# Patient Record
Sex: Male | Born: 1985 | Race: White | Hispanic: No | Smoking: Current every day smoker
Health system: Southern US, Community
[De-identification: ages and names within clinical notes are randomized; demographics above are authoritative.]

---

## 2020-02-27 ENCOUNTER — Emergency Department: Payer: Medicaid Other

## 2020-02-27 ENCOUNTER — Encounter: Payer: Self-pay | Admitting: Emergency Medicine

## 2020-02-27 ENCOUNTER — Other Ambulatory Visit: Payer: Self-pay

## 2020-02-27 ENCOUNTER — Emergency Department
Admission: EM | Admit: 2020-02-27 | Discharge: 2020-02-28 | Disposition: A | Discharge status: Other Acute Inpatient Hospital | Payer: Medicaid Other | Attending: Emergency Medicine | Admitting: Emergency Medicine

## 2020-02-27 DIAGNOSIS — F329 Major depressive disorder, single episode, unspecified: Secondary | ICD-10-CM | POA: Insufficient documentation

## 2020-02-27 DIAGNOSIS — R45851 Suicidal ideations: Secondary | ICD-10-CM | POA: Insufficient documentation

## 2020-02-27 DIAGNOSIS — Z20822 Contact with and (suspected) exposure to covid-19: Secondary | ICD-10-CM | POA: Insufficient documentation

## 2020-02-27 DIAGNOSIS — F10929 Alcohol use, unspecified with intoxication, unspecified: Secondary | ICD-10-CM

## 2020-02-27 DIAGNOSIS — F10920 Alcohol use, unspecified with intoxication, uncomplicated: Secondary | ICD-10-CM | POA: Insufficient documentation

## 2020-02-27 DIAGNOSIS — F332 Major depressive disorder, recurrent severe without psychotic features: Secondary | ICD-10-CM | POA: Diagnosis present

## 2020-02-27 LAB — URINE DRUG SCREEN, QUALITATIVE (ARMC ONLY)
Amphetamines, Ur Screen: NOT DETECTED
Barbiturates, Ur Screen: NOT DETECTED
Benzodiazepine, Ur Scrn: NOT DETECTED
Cannabinoid 50 Ng, Ur ~~LOC~~: POSITIVE — AB
Cocaine Metabolite,Ur ~~LOC~~: NOT DETECTED
MDMA (Ecstasy)Ur Screen: NOT DETECTED
Methadone Scn, Ur: NOT DETECTED
Opiate, Ur Screen: NOT DETECTED
Phencyclidine (PCP) Ur S: NOT DETECTED
Tricyclic, Ur Screen: NOT DETECTED

## 2020-02-27 LAB — COMPREHENSIVE METABOLIC PANEL
ALT: 31 U/L (ref 0–44)
AST: 58 U/L — ABNORMAL HIGH (ref 15–41)
Albumin: 5.5 g/dL — ABNORMAL HIGH (ref 3.5–5.0)
Alkaline Phosphatase: 100 U/L (ref 38–126)
Anion gap: 11 (ref 5–15)
BUN: 8 mg/dL (ref 6–20)
CO2: 23 mmol/L (ref 22–32)
Calcium: 9.5 mg/dL (ref 8.9–10.3)
Chloride: 108 mmol/L (ref 98–111)
Creatinine, Ser: 0.84 mg/dL (ref 0.61–1.24)
GFR calc Af Amer: >60 mL/min (ref >60)
GFR calc non Af Amer: >60 mL/min (ref >60)
Glucose, Bld: 86 mg/dL (ref 70–99)
Potassium: 3.7 mmol/L (ref 3.5–5.1)
Sodium: 142 mmol/L (ref 135–145)
Total Bilirubin: 1.2 mg/dL (ref 0.3–1.2)
Total Protein: 8.3 g/dL — ABNORMAL HIGH (ref 6.5–8.1)

## 2020-02-27 LAB — ACETAMINOPHEN LEVEL: Acetaminophen (Tylenol), Serum: <10 ug/mL — ABNORMAL LOW (ref 10–30)

## 2020-02-27 LAB — CBC
HCT: 47.9 % (ref 39.0–52.0)
Hemoglobin: 17.3 g/dL — ABNORMAL HIGH (ref 13.0–17.0)
MCH: 30.6 pg (ref 26.0–34.0)
MCHC: 36.1 g/dL — ABNORMAL HIGH (ref 30.0–36.0)
MCV: 84.8 fL (ref 80.0–100.0)
Platelets: 269 10*3/uL (ref 150–400)
RBC: 5.65 MIL/uL (ref 4.22–5.81)
RDW: 13.4 % (ref 11.5–15.5)
WBC: 9.4 10*3/uL (ref 4.0–10.5)
nRBC: 0 % (ref 0.0–0.2)

## 2020-02-27 LAB — SALICYLATE LEVEL: Salicylate Lvl: <7 mg/dL — ABNORMAL LOW (ref 7.0–30.0)

## 2020-02-27 LAB — RESPIRATORY PANEL BY RT PCR (FLU A&B, COVID)
Influenza A by PCR: NEGATIVE
Influenza B by PCR: NEGATIVE
SARS Coronavirus 2 by RT PCR: NEGATIVE

## 2020-02-27 LAB — ETHANOL: Alcohol, Ethyl (B): 111 mg/dL — ABNORMAL HIGH (ref <10)

## 2020-02-27 MED ORDER — ACETAMINOPHEN 500 MG PO TABS
1000.0000 mg | ORAL_TABLET | Freq: Once | ORAL | Status: AC
  Administered 2020-02-27: 1000 mg via ORAL
  Filled 2020-02-27: qty 2

## 2020-02-27 MED ORDER — DIAZEPAM 5 MG PO TABS
10.0000 mg | ORAL_TABLET | Freq: Once | ORAL | Status: AC
  Administered 2020-02-27: 10 mg via ORAL
  Filled 2020-02-27: qty 2
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
## 2020-02-27 NOTE — ED Provider Notes (Addendum)
[facility] Emergency Department Provider Note       Time seen: ----------------------------------------- 6:57 PM on 02/27/2020 -----------------------------------------   I have reviewed the triage vital signs and the nursing notes.  HISTORY   Chief Complaint Suicide Attempt    HPI Jeremy Becker is a 34 y.o. male with no known past medical history who presents to the ED for recent suicide attempt.  Patient reports last night try to kill himself by hanging himself in his hotel room but the rope broke and he landed on his left wrist.  He is complaining of left wrist pain.  He states he is never felt this way before, recently moved here from [place] to be near his kids.  Discomfort is 7 out of 10 in his left wrist.  History reviewed. No pertinent past medical history.  There are no problems to display for this patient.   History reviewed. No pertinent surgical history.  Allergies Patient has no allergy information on record.  Social History Social History   Tobacco Use  . Smoking status: Not on file  Substance Use Topics  . Alcohol use: Not on file  . Drug use: Not on file    Review of Systems Constitutional: Negative for fever. Cardiovascular: Negative for chest pain. Respiratory: Negative for shortness of breath. Gastrointestinal: Negative for abdominal pain, vomiting and diarrhea. Musculoskeletal: Positive for left wrist pain Skin: Negative for rash. Neurological: Negative for headaches, focal weakness or numbness. Psychiatric: Positive for left wrist pain  All systems negative/normal/unremarkable except as stated in the HPI  ____________________________________________   PHYSICAL EXAM:  VITAL SIGNS: ED Triage Vitals  Enc Vitals Group     BP 02/27/20 1813 (!) 132/98     Pulse Rate 02/27/20 1813 86     Resp 02/27/20 1813 20     Temp 02/27/20 1813 98.6 F (37 C)     Temp Source 02/27/20 1813 Oral     SpO2 02/27/20 1813 99 %   Weight 02/27/20 1811 190 lb (86.2 kg)     Height 02/27/20 1811 6\' 1"  (1.854 m)     Head Circumference --      Peak Flow --      Pain Score 02/27/20 1811 7     Pain Loc --      Pain Edu? --      Excl. in [place]? --     Constitutional: Alert and oriented. Well appearing and in no distress. Eyes: Conjunctivae are normal. Normal extraocular movements. Cardiovascular: Normal rate, regular rhythm. No murmurs, rubs, or gallops. Respiratory: Normal respiratory effort without tachypnea nor retractions. Breath sounds are clear and equal bilaterally. No wheezes/rales/rhonchi. Gastrointestinal: Soft and nontender. Normal bowel sounds Musculoskeletal: Pain and tenderness around the left wrist, dorsal lateral ecchymosis is noted Neurologic:  Normal speech and language. No gross focal neurologic deficits are appreciated.  Skin: Superficial lacerations are noted over the left wrist anteriorly and distally Psychiatric: Depressed mood and affect ____________________________________________  ED COURSE:  As part of my medical decision making, I reviewed the following data within the [place] History obtained from family if available, nursing notes, old chart and ekg, as well as notes from prior ED visits. Patient presented for recent suicide attempt, we will assess with labs and imaging as indicated at this time.   Procedures  Lavern Maslow was evaluated in Emergency Department on 02/27/2020 for the symptoms described in the history of present illness. He was evaluated in the context of the [place] COVID-19  pandemic, which necessitated consideration that the patient might be at risk for infection with the SARS-CoV-2 virus that causes COVID-19. Institutional protocols and algorithms that pertain to the evaluation of patients at risk for COVID-19 are in a state of rapid change based on information released by regulatory bodies including the CDC and federal and state organizations. These policies  and algorithms were followed during the patient's care in the ED.  ____________________________________________   LABS (pertinent positives/negatives)  Labs Reviewed  COMPREHENSIVE METABOLIC PANEL - Abnormal; Notable for the following components:      Result Value   Total Protein 8.3 (*)    Albumin 5.5 (*)    AST 58 (*)    All other components within normal limits  ETHANOL - Abnormal; Notable for the following components:   Alcohol, Ethyl (B) 111 (*)    All other components within normal limits  SALICYLATE LEVEL - Abnormal; Notable for the following components:   Salicylate Lvl <7.0 (*)    All other components within normal limits  ACETAMINOPHEN LEVEL - Abnormal; Notable for the following components:   Acetaminophen (Tylenol), Serum <10 (*)    All other components within normal limits  CBC - Abnormal; Notable for the following components:   Hemoglobin 17.3 (*)    MCHC 36.1 (*)    All other components within normal limits  URINE DRUG SCREEN, QUALITATIVE ([facility] ONLY) - Abnormal; Notable for the following components:   Cannabinoid 50 Ng, Ur [place] POSITIVE (*)    All other components within normal limits  RESPIRATORY PANEL BY RT PCR (FLU A&B, COVID)    RADIOLOGY  Left wrist x-ray IMPRESSION:  No acute abnormality noted.  ____________________________________________   DIFFERENTIAL DIAGNOSIS   Suicide attempt, depression, substance abuse, fracture, contusion, sprain  FINAL ASSESSMENT AND PLAN  Suicide attempt, SI, depression, alcohol intoxication   Plan: The patient had presented for recent suicide attempt. Patient's labs were unremarkable with exception of mild alcohol intoxication. Patient's imaging was negative for any acute process.  He appears medically clear for psychiatric evaluation and disposition.   Ulice Dash, MD    Note: This note was generated in part or whole with voice recognition software. Voice recognition is usually quite accurate but there are  transcription errors that can and very often do occur. I apologize for any typographical errors that were not detected and corrected.     Emily Filbert, MD 02/27/20 1950    Emily Filbert, MD 02/27/20 [id number]

## 2020-02-27 NOTE — ED Notes (Addendum)
Pt dressed out in burgundy scrubs with this tech and [place], EDT in the rm. Pt belongings consist of a white shirt, one pair of jeans, white tennis shoes, white socks, black/yellow bracelet, a set of keys, a black wallet, black boxers and a black hair bow. Pt calm and cooperative while dressing out. Pt belongings placed into a pt belongings bag and labeled with pt name.    Pt has nipple rings that is unable to come out.

## 2020-02-27 NOTE — ED Notes (Signed)
Pt given meal tray states he does not have an appetite at this time. Meal tray set aside for pt when he is hungry, pt denies any needs at this time

## 2020-02-27 NOTE — ED Notes (Signed)
Pt reports no current SI/HI to this RN. Pt reports no auditory/visual disturbances. Pt is calm and cooperative, provided with cup of water. No other needs expressed at this time

## 2020-02-27 NOTE — ED Triage Notes (Signed)
Pt c/o pain and decreased movement of left wrist after he fell last night when the rope broke

## 2020-02-27 NOTE — ED Notes (Signed)
Pt in interview room talking with NP Annice Pih

## 2020-02-27 NOTE — ED Triage Notes (Signed)
Pt reports he tried to kill himself by hanging himself last pm but the rope broke.

## 2020-02-27 NOTE — ED Triage Notes (Signed)
FIRST NURSE NOTE- here for SI and fall.

## 2020-02-27 NOTE — ED Notes (Signed)
Pt in interview room talking with tts. 

## 2020-02-28 ENCOUNTER — Encounter: Payer: Self-pay | Admitting: Behavioral Health

## 2020-02-28 ENCOUNTER — Inpatient Hospital Stay
Admission: AD | Admit: 2020-02-28 | Discharge: 2020-02-29 | DRG: 885 | Disposition: A | Discharge status: Home / Self Care | Payer: No Typology Code available for payment source | Source: Intra-hospital | Attending: Psychiatry | Admitting: Psychiatry

## 2020-02-28 DIAGNOSIS — G4700 Insomnia, unspecified: Secondary | ICD-10-CM | POA: Diagnosis present

## 2020-02-28 DIAGNOSIS — F419 Anxiety disorder, unspecified: Secondary | ICD-10-CM | POA: Diagnosis present

## 2020-02-28 DIAGNOSIS — R41843 Psychomotor deficit: Secondary | ICD-10-CM | POA: Diagnosis present

## 2020-02-28 DIAGNOSIS — F332 Major depressive disorder, recurrent severe without psychotic features: Secondary | ICD-10-CM | POA: Diagnosis present

## 2020-02-28 DIAGNOSIS — F1010 Alcohol abuse, uncomplicated: Secondary | ICD-10-CM | POA: Diagnosis present

## 2020-02-28 MED ORDER — HYDROXYZINE HCL 25 MG PO TABS
25.0000 mg | ORAL_TABLET | Freq: Four times a day (QID) | ORAL | Status: DC | PRN
  Administered 2020-02-28 (×2): 25 mg via ORAL
  Filled 2020-02-28 (×2): qty 1

## 2020-02-28 MED ORDER — ALUM & MAG HYDROXIDE-SIMETH 200-200-20 MG/5ML PO SUSP: 30.0000 mL | ORAL | Status: DC | PRN

## 2020-02-28 MED ORDER — IBUPROFEN 600 MG PO TABS
600.0000 mg | ORAL_TABLET | Freq: Four times a day (QID) | ORAL | Status: DC | PRN
  Administered 2020-02-28: 600 mg via ORAL
  Filled 2020-02-28: qty 1

## 2020-02-28 MED ORDER — FLUOXETINE HCL 20 MG PO CAPS: 20.0000 mg | ORAL_CAPSULE | Freq: Every day | ORAL | 0 refills | Status: DC

## 2020-02-28 MED ORDER — FLUOXETINE HCL 20 MG PO CAPS
20.0000 mg | ORAL_CAPSULE | Freq: Every day | ORAL | Status: DC
  Administered 2020-02-28 – 2020-02-29 (×2): 20 mg via ORAL
  Filled 2020-02-28 (×2): qty 1

## 2020-02-28 MED ORDER — ADULT MULTIVITAMIN W/MINERALS CH
1.0000 | ORAL_TABLET | Freq: Every day | ORAL | Status: DC
  Administered 2020-02-29: 1 via ORAL
  Filled 2020-02-28: qty 1

## 2020-02-28 MED ORDER — TRAZODONE HCL 100 MG PO TABS: 100.0000 mg | ORAL_TABLET | Freq: Every evening | ORAL | 0 refills | Status: DC | PRN

## 2020-02-28 MED ORDER — MAGNESIUM HYDROXIDE 400 MG/5ML PO SUSP: 30.0000 mL | Freq: Every day | ORAL | Status: DC | PRN

## 2020-02-28 MED ORDER — TRAZODONE HCL 100 MG PO TABS: 100.0000 mg | ORAL_TABLET | Freq: Every evening | ORAL | Status: DC | PRN

## 2020-02-28 MED ORDER — ACETAMINOPHEN 325 MG PO TABS
650.0000 mg | ORAL_TABLET | Freq: Four times a day (QID) | ORAL | Status: DC | PRN
  Administered 2020-02-28: 650 mg via ORAL
  Filled 2020-02-28: qty 2

## 2020-02-28 NOTE — BHH Suicide Risk Assessment (Signed)
[facility] Admission Suicide Risk Assessment   Nursing information obtained from:  Patient Demographic factors:  Male, Divorced or widowed, Caucasian, Low socioeconomic status, Unemployed Current Mental Status:  NA Loss Factors:  Financial problems / change in socioeconomic status Historical Factors:  Prior suicide attempts(attempted hanging 02/27/20) Risk Reduction Factors:  Responsible for children under 35 years of age, Sense of responsibility to family, Living with another person, especially a relative  Total Time spent with patient: 1 hour Principal Problem: MDD (major depressive disorder), recurrent episode, severe ([place]) Diagnosis:  Principal Problem:   MDD (major depressive disorder), recurrent episode, severe ([place]) Active Problems:   Alcohol abuse  Subjective Data: Patient seen chart reviewed.  34 year old man with a history of alcohol abuse and depression brought to the hospital after trying to hang himself.  Reports multiple symptoms of depression and ongoing abuse of alcohol.  Patient denies suicidal intent in the hospital and is cooperative and agreeable to treatment  Continued Clinical Symptoms:  Alcohol Use Disorder Identification Test Final Score (AUDIT): 30 The "Alcohol Use Disorders Identification Test", Guidelines for Use in Primary Care, Second Edition.  World Pharmacologist [facility]). Score between 0-7:  no or low risk or alcohol related problems. Score between 8-15:  moderate risk of alcohol related problems. Score between 16-19:  high risk of alcohol related problems. Score 20 or above:  warrants further diagnostic evaluation for alcohol dependence and treatment.   CLINICAL FACTORS:   Depression:   Comorbid alcohol abuse/dependence Alcohol/Substance Abuse/Dependencies   Musculoskeletal: Strength & Muscle Tone: within normal limits Gait & Station: normal Patient leans: N/A  Psychiatric Specialty Exam: Physical Exam  Nursing note and vitals  reviewed. Constitutional: He appears well-developed and well-nourished.  HENT:  Head: Normocephalic and atraumatic.  Eyes: Pupils are equal, round, and reactive to light. Conjunctivae are normal.  Cardiovascular: Regular rhythm and normal heart sounds.  Respiratory: Effort normal.  GI: Soft.  Musculoskeletal:        General: Normal range of motion.     Cervical back: Normal range of motion.  Neurological: He is alert.  Skin: Skin is warm and dry.  Psychiatric: His speech is delayed. He is slowed. Cognition and memory are impaired. He expresses impulsivity. He exhibits a depressed mood. He expresses suicidal ideation. He expresses suicidal plans.    Review of Systems  Constitutional: Negative.   HENT: Negative.   Eyes: Negative.   Respiratory: Negative.   Cardiovascular: Negative.   Gastrointestinal: Negative.   Musculoskeletal: Negative.   Skin: Negative.   Neurological: Negative.   Psychiatric/Behavioral: Positive for dysphoric mood and suicidal ideas.    Blood pressure 132/85, pulse 76, temperature 97.8 F (36.6 C), temperature source Oral, resp. rate 18, height _0  (1.854 m), weight 80.7 kg, SpO2 97 %.Body mass index is 23.48 kg/m.  General Appearance: Casual  Eye Contact:  Fair  Speech:  Clear and Coherent  Volume:  Decreased  Mood:  Depressed and Dysphoric  Affect:  Constricted  Thought Process:  Coherent  Orientation:  Full (Time, Place, and Person)  Thought Content:  Logical  Suicidal Thoughts:  Yes.  with intent/plan  Homicidal Thoughts:  No  Memory:  Immediate;   Fair Recent;   Fair Remote;   Fair  Judgement:  Fair  Insight:  Fair  Psychomotor Activity:  Normal  Concentration:  Concentration: Fair  Recall:  [employer] of Knowledge:  Fair  Language:  Fair  Akathisia:  No  Handed:  Right  AIMS (if indicated):  Assets:  Desire for Improvement Housing Physical Health Resilience  ADL's:  Intact  Cognition:  WNL  Sleep:  Number of Hours: 2.3       COGNITIVE FEATURES THAT CONTRIBUTE TO RISK:  Loss of executive function    SUICIDE RISK:   Mild:  Suicidal ideation of limited frequency, intensity, duration, and specificity.  There are no identifiable plans, no associated intent, mild dysphoria and related symptoms, good self-control (both objective and subjective assessment), few other risk factors, and identifiable protective factors, including available and accessible social support.  PLAN OF CARE: Continue 15-minute checks.  Start medicine for depression.  Engage in individual and group therapy.  Patient has already met with representative from [place].  Work on appropriate discharge planning.  I certify that inpatient services furnished can reasonably be expected to improve the patient's condition.   Alethia Berthold, MD 02/28/2020, 4:40 PM

## 2020-02-28 NOTE — ED Provider Notes (Signed)
Emergency Medicine Observation Re-evaluation Note  Leelyn Jasinski is a 34 y.o. male, seen on rounds today.  Pt initially presented to the ED for complaints of Suicide Attempt Currently, the patient is resting.  Physical Exam  BP (!) 132/98 (BP Location: Right Arm)   Pulse 86   Temp 98.6 F (37 C) (Oral)   Resp 20   Ht 1.854 m (6\' 1" )   Wt 86.2 kg   SpO2 99%   BMI 25.07 kg/m  Physical Exam  ED Course / MDM  EKG:    I have reviewed the labs performed to date as well as medications administered while in observation.  Recent changes in the last 24 hours include a called from [place] (TTS).  The patient has been accepted to [facility] and will be taken downstairs tonight. Plan  Current plan is for admission to [facility]. Patient is not under full IVC at this time.   [facility], MD 02/28/20 0030
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
## 2020-02-28 NOTE — Tx Team (Signed)
Initial Treatment Plan 02/28/2020 4:36 AM Dyane Dustman JQG:[id number]    PATIENT STRESSORS: Financial difficulties Marital or family conflict Substance abuse   PATIENT STRENGTHS: Ability for insight Motivation for treatment/growth Supportive family/friends   PATIENT IDENTIFIED PROBLEMS: Unemployment   Substance abuse  Marital Conflict                 DISCHARGE CRITERIA:  Ability to meet basic life and health needs Motivation to continue treatment in a less acute level of care Verbal commitment to aftercare and medication compliance  PRELIMINARY DISCHARGE PLAN: Attend aftercare/continuing care group Return to previous living arrangement  PATIENT/FAMILY INVOLVEMENT: This treatment plan has been presented to and reviewed with the patient, Kawika Bischoff.  The patient has been given the opportunity to ask questions and make suggestions.  Anayla Giannetti P Cyndel Griffey, LPN 1/97/5883, 2:54 AM

## 2020-02-28 NOTE — [facility] Suicide Risk Assessment (Signed)
[facility] INPATIENT:  Family/Significant Other Suicide Prevention Education  Suicide Prevention Education:  Patient Refusal for Family/Significant Other Suicide Prevention Education: The patient Jeremy Becker has refused to provide written consent for family/significant other to be provided Family/Significant Other Suicide Prevention Education during admission and/or prior to discharge.  Physician notified.  SPE completed with pt, as pt refused to consent to family contact. SPI pamphlet provided to pt and pt was encouraged to share information with support network, ask questions, and talk about any concerns relating to SPE. Pt denies access to guns/firearms and verbalized understanding of information provided. Mobile Crisis information also provided to pt.    Charlann Lange Tayson Schnelle MSW LCSW 02/28/2020, 11:01 AM

## 2020-02-28 NOTE — [facility] Group Notes (Signed)

## 2020-02-28 NOTE — Progress Notes (Signed)
Recreation Therapy Notes  Date: 02/28/2020  Time: 9:30 am   Location: Craft room   Behavioral response: N/A   Intervention Topic: Problem-Solving    Discussion/Intervention: Patient did not attend group.   Clinical Observations/Feedback:  Patient did not attend group.   Erique Kaser LRT/CTRS        Nhat Hearne 02/28/2020 11:25 AM

## 2020-02-28 NOTE — Tx Team (Addendum)
Interdisciplinary Treatment and Diagnostic Plan Update  02/28/2020 Time of Session: 900am Jeremy Becker MRN: [id number]  Principal Diagnosis: <principal problem not specified>  Secondary Diagnoses: Active Problems:   MDD (major depressive disorder), recurrent episode, severe (HCC)   Current Medications:  Current Facility-Administered Medications  Medication Dose Route Frequency Provider Last Rate Last Admin  . acetaminophen (TYLENOL) tablet 650 mg  650 mg Oral Q6H PRN Caroline Sauger, NP   650 mg at 02/28/20 0325  . alum & mag hydroxide-simeth (MAALOX/MYLANTA) 200-200-20 MG/5ML suspension 30 mL  30 mL Oral Q4H PRN Caroline Sauger, NP      . hydrOXYzine (ATARAX/VISTARIL) tablet 25 mg  25 mg Oral Q6H PRN Caroline Sauger, NP   25 mg at 02/28/20 0325  . magnesium hydroxide (MILK OF MAGNESIA) suspension 30 mL  30 mL Oral Daily PRN Caroline Sauger, NP      . Derrill Memo ON 02/29/2020] multivitamin with minerals tablet 1 tablet  1 tablet Oral Daily Clapacs, John T, MD      . traZODone (DESYREL) tablet 100 mg  100 mg Oral QHS PRN Caroline Sauger, NP       PTA Medications: No medications prior to admission.    Patient Stressors: Financial difficulties Marital or family conflict Substance abuse  Patient Strengths: Ability for insight Motivation for treatment/growth Supportive family/friends  Treatment Modalities: Medication Management, Group therapy, Case management,  1 to 1 session with clinician, Psychoeducation, Recreational therapy.   Physician Treatment Plan for Primary Diagnosis: <principal problem not specified> Long Term Goal(s):     Short Term Goals:    Medication Management: Evaluate patient's response, side effects, and tolerance of medication regimen.  Therapeutic Interventions: 1 to 1 sessions, Unit Group sessions and Medication administration.  Evaluation of Outcomes: Not Met  Physician Treatment Plan for Secondary Diagnosis: Active Problems:  MDD (major depressive disorder), recurrent episode, severe ([place])  Long Term Goal(s):     Short Term Goals:       Medication Management: Evaluate patient's response, side effects, and tolerance of medication regimen.  Therapeutic Interventions: 1 to 1 sessions, Unit Group sessions and Medication administration.  Evaluation of Outcomes: Not Met   RN Treatment Plan for Primary Diagnosis: <principal problem not specified> Long Term Goal(s): Knowledge of disease and therapeutic regimen to maintain health will improve  Short Term Goals: Ability to verbalize frustration and anger appropriately will improve, Ability to demonstrate self-control, Ability to participate in decision making will improve and Compliance with prescribed medications will improve  Medication Management: RN will administer medications as ordered by provider, will assess and evaluate patient's response and provide education to patient for prescribed medication. RN will report any adverse and/or side effects to prescribing provider.  Therapeutic Interventions: 1 on 1 counseling sessions, Psychoeducation, Medication administration, Evaluate responses to treatment, Monitor vital signs and CBGs as ordered, Perform/monitor CIWA, COWS, AIMS and Fall Risk screenings as ordered, Perform wound care treatments as ordered.  Evaluation of Outcomes: Not Met   LCSW Treatment Plan for Primary Diagnosis: <principal problem not specified> Long Term Goal(s): Safe transition to appropriate next level of care at discharge, Engage patient in therapeutic group addressing interpersonal concerns.  Short Term Goals: Engage patient in aftercare planning with referrals and resources, Increase ability to appropriately verbalize feelings, Increase emotional regulation and Increase skills for wellness and recovery  Therapeutic Interventions: Assess for all discharge needs, 1 to 1 time with Social worker, Explore available resources and support systems,  Assess for adequacy in community support network, Educate family and  significant other(s) on suicide prevention, Complete Psychosocial Assessment, Interpersonal group therapy.  Evaluation of Outcomes: Not Met   Progress in Treatment: Attending groups: No. Participating in groups: No. Taking medication as prescribed: Yes. Toleration medication: Yes. Family/Significant other contact made: No, will contact:  pt declined consent Patient understands diagnosis: Yes. Discussing patient identified problems/goals with staff: Yes. Medical problems stabilized or resolved: Yes. Denies suicidal/homicidal ideation: Yes. Issues/concerns per patient self-inventory: No. Other: N/A  New problem(s) identified: No, Describe:  none  New Short Term/Long Term Goal(s): Detox, elimination of AVH/symptoms of psychosis, medication management for mood stabilization; elimination of SI thoughts; development of comprehensive mental wellness/sobriety plan.   Patient Goals:  "I just want to get some help"  Discharge Plan or Barriers: SPE pamphlet, Mobile Crisis information, and AA/NA information provided to patient for additional community support and resources.   Reason for Continuation of Hospitalization: Aggression Depression Medication stabilization  Estimated Length of Stay: 3-5 days  Recreational Therapy: Patient Stressors: N/A Patient Goal: Patient will engage in groups without prompting or encouragement from LRT x3 group sessions within 5 recreation therapy group sessions   Attendees: Patient: Jeremy Becker 02/28/2020 11:04 AM  Physician: Dr Weber Cooks MD 02/28/2020 11:04 AM  Nursing: Collier Bullock RN 02/28/2020 11:04 AM  RN Care Manager: 02/28/2020 11:04 AM  Social Worker: Minette Brine Moton LCSW 02/28/2020 11:04 AM  Recreational Therapist: Roanna Epley CTRS LRT 02/28/2020 11:04 AM  Other: Assunta Curtis LCSW 02/28/2020 11:04 AM  Other:  02/28/2020 11:04 AM  Other: 02/28/2020 11:04 AM    Scribe for  Treatment Team: Mariann Laster Moton, LCSW 02/28/2020 11:04 AM

## 2020-02-28 NOTE — [facility] Group Notes (Signed)
[facility] Group Notes:  (Nursing/MHT/Case Management/Adjunct)  Date:  02/28/2020  Time:  8:53 PM  Type of Therapy:  Group Therapy  Participation Level:  Did Not Attend  Jeremy Becker 02/28/2020, 8:53 PM

## 2020-02-28 NOTE — Progress Notes (Signed)
Initial Nutrition Assessment  DOCUMENTATION CODES:   Not applicable  INTERVENTION:   Ensure Enlive po TID, each supplement provides 350 kcal and 20 grams of protein  MVI daily  NUTRITION DIAGNOSIS:   Inadequate oral intake related to social / environmental circumstances(depression) as evidenced by per patient/family report.  GOAL:   Patient will meet greater than or equal to 90% of their needs  MONITOR:   PO intake, Supplement acceptance  REASON FOR ASSESSMENT:   Malnutrition Screening Tool    ASSESSMENT:   34 y.o. male who reports ETOH abuse who presents with suicide attempt and newly diagnosed MDD   Per chart review, pt reports decreased appetite and oral intake pta r/t depression. Pt has continued to have decreased oral intake in hospital. RD will add supplements and MVI to help pt meet his estimated needs. There is no weight history in chart to determine if any significant changes pta.   Medications and labs reviewed:   Diet Order:   Diet Order            DIET FINGER FOODS Room service appropriate? Yes; Fluid consistency: Thin  Diet effective now             EDUCATION NEEDS:   No education needs have been identified at this time  Skin:  Skin Assessment: Reviewed RN Assessment  Last BM:  4/14  Height:   Ht Readings from Last 1 Encounters:  02/28/20 6\' 1"  (1.854 m)    Weight:   Wt Readings from Last 1 Encounters:  02/28/20 80.7 kg    Ideal Body Weight:  83.6 kg  BMI:  Body mass index is 23.48 kg/m.  Estimated Nutritional Needs:   Kcal:  2400-2700kcal/day  Protein:  120-135g/day  Fluid:  >2.4L/day  03/01/20 MS, RD, LDN Please refer to [facility] for RD and/or RD on-call/weekend/after hours pager

## 2020-02-28 NOTE — ED Notes (Signed)
Called [facility] to give report, informed receiving RN is on lunch and will call back shortly

## 2020-02-28 NOTE — Plan of Care (Signed)
New Admission   Problem: Consults Goal: Concurrent Medical Patient Education Description: (See Patient Education Module for education specifics) Outcome: Not Progressing   Problem: [facility] Concurrent Medical Problem Goal: LTG-Pt will be physically stable and he/significant other Description: (Patient will be physically stable and he/significant other will be able to verbalize understanding of follow-up care and symptoms that would warrant further treatment) Outcome: Not Progressing Goal: STG-Vital signs will be within defined limits or stabilized Description: (STG- Vital signs will be within defined limits or stabilized for individual) Outcome: Not Progressing Goal: STG-Patient will participate in management/stabilization Description: (STG-Patient will participate in management/stabilization of medical condition) Outcome: Not Progressing   Problem: Education: Goal: Knowledge of disease or condition will improve Outcome: Not Progressing Goal: Understanding of discharge needs will improve Outcome: Not Progressing   Problem: Health Behavior/Discharge Planning: Goal: Ability to identify changes in lifestyle to reduce recurrence of condition will improve Outcome: Not Progressing Goal: Identification of resources available to assist in meeting health care needs will improve Outcome: Not Progressing   Problem: Physical Regulation: Goal: Complications related to the disease process, condition or treatment will be avoided or minimized Outcome: Not Progressing   Problem: Safety: Goal: Ability to remain free from injury will improve Outcome: Not Progressing   Problem: Education: Goal: Utilization of techniques to improve thought processes will improve Outcome: Not Progressing Goal: Knowledge of the prescribed therapeutic regimen will improve Outcome: Not Progressing   Problem: Activity: Goal: Interest or engagement in leisure activities will improve Outcome: Not Progressing Goal:  Imbalance in normal sleep/wake cycle will improve Outcome: Not Progressing   Problem: Coping: Goal: Coping ability will improve Outcome: Not Progressing Goal: Will verbalize feelings Outcome: Not Progressing   Problem: Health Behavior/Discharge Planning: Goal: Ability to make decisions will improve Outcome: Not Progressing Goal: Compliance with therapeutic regimen will improve Outcome: Not Progressing   Problem: Role Relationship: Goal: Will demonstrate positive changes in social behaviors and relationships Outcome: Not Progressing   Problem: Safety: Goal: Ability to disclose and discuss suicidal ideas will improve Outcome: Not Progressing Goal: Ability to identify and utilize support systems that promote safety will improve Outcome: Not Progressing   Problem: Self-Concept: Goal: Will verbalize positive feelings about self Outcome: Not Progressing Goal: Level of anxiety will decrease Outcome: Not Progressing

## 2020-02-28 NOTE — ED Notes (Signed)
Patient has been accepted to [facility].  Patient assigned to room 312 Accepting physician is Elenore Paddy.  Call report to [phone]  Representative was [place].   ER Staff is aware of it:  Carlene ER Secretary  Dr. York Cerise, ER MD  Anette Riedel Patient's Nurse
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
## 2020-02-28 NOTE — Plan of Care (Signed)
Pt denies depression, SI, HI and AVH. Pt rates anxiety 8/10. Pt was educated on care plan and verbalizes understanding. Jeremy Mayers RN Problem: Consults Goal: Concurrent Medical Patient Education Description: (See Patient Education Module for education specifics) Outcome: Progressing   Problem: [facility] Concurrent Medical Problem Goal: LTG-Pt will be physically stable and he/significant other Description: (Patient will be physically stable and he/significant other will be able to verbalize understanding of follow-up care and symptoms that would warrant further treatment) Outcome: Progressing Goal: STG-Vital signs will be within defined limits or stabilized Description: (STG- Vital signs will be within defined limits or stabilized for individual) Outcome: Progressing Goal: STG-Patient will participate in management/stabilization Description: (STG-Patient will participate in management/stabilization of medical condition) Outcome: Progressing   Problem: Education: Goal: Knowledge of disease or condition will improve Outcome: Progressing Goal: Understanding of discharge needs will improve Outcome: Progressing   Problem: Health Behavior/Discharge Planning: Goal: Ability to identify changes in lifestyle to reduce recurrence of condition will improve Outcome: Progressing Goal: Identification of resources available to assist in meeting health care needs will improve Outcome: Progressing   Problem: Physical Regulation: Goal: Complications related to the disease process, condition or treatment will be avoided or minimized Outcome: Progressing   Problem: Safety: Goal: Ability to remain free from injury will improve Outcome: Progressing   Problem: Education: Goal: Utilization of techniques to improve thought processes will improve Outcome: Progressing Goal: Knowledge of the prescribed therapeutic regimen will improve Outcome: Progressing   Problem: Activity: Goal: Interest or engagement  in leisure activities will improve Outcome: Progressing Goal: Imbalance in normal sleep/wake cycle will improve Outcome: Progressing   Problem: Coping: Goal: Coping ability will improve Outcome: Progressing Goal: Will verbalize feelings Outcome: Progressing   Problem: Health Behavior/Discharge Planning: Goal: Ability to make decisions will improve Outcome: Progressing Goal: Compliance with therapeutic regimen will improve Outcome: Progressing   Problem: Role Relationship: Goal: Will demonstrate positive changes in social behaviors and relationships Outcome: Progressing   Problem: Safety: Goal: Ability to disclose and discuss suicidal ideas will improve Outcome: Progressing Goal: Ability to identify and utilize support systems that promote safety will improve Outcome: Progressing   Problem: Self-Concept: Goal: Will verbalize positive feelings about self Outcome: Progressing Goal: Level of anxiety will decrease Outcome: Progressing

## 2020-02-29 MED ORDER — TRAZODONE HCL 100 MG PO TABS: 100.0000 mg | ORAL_TABLET | Freq: Every evening | ORAL | 1 refills | Status: AC | PRN

## 2020-02-29 MED ORDER — FLUOXETINE HCL 20 MG PO CAPS: 20.0000 mg | ORAL_CAPSULE | Freq: Every day | ORAL | 1 refills | Status: AC

## 2020-02-29 NOTE — Progress Notes (Signed)
Recreation Therapy Notes    Date: 02/29/2020  Time: 9:30 am   Location: Craft room   Behavioral response: N/A   Intervention Topic: Coping-Skills   Discussion/Intervention: Patient did not attend group.   Clinical Observations/Feedback:  Patient did not attend group.   Mariella Blackwelder LRT/CTRS        Antonia Culbertson 02/29/2020 11:42 AM

## 2020-02-29 NOTE — [facility] Suicide Risk Assessment (Signed)
[facility] Discharge Suicide Risk Assessment   Principal Problem: MDD (major depressive disorder), recurrent episode, severe (HCC) Discharge Diagnoses: Principal Problem:   MDD (major depressive disorder), recurrent episode, severe (HCC) Active Problems:   Alcohol abuse   Total Time spent with patient: 30 minutes  Musculoskeletal: Strength & Muscle Tone: within normal limits Gait & Station: normal Patient leans: N/A  Psychiatric Specialty Exam: Review of Systems  Constitutional: Negative.   HENT: Negative.   Eyes: Negative.   Respiratory: Negative.   Cardiovascular: Negative.   Gastrointestinal: Negative.   Musculoskeletal: Negative.   Skin: Negative.   Neurological: Negative.   Psychiatric/Behavioral: Negative.     Blood pressure (!) 144/79, pulse (!) 59, temperature 97.7 F (36.5 C), temperature source Oral, resp. rate 18, height 6\' 1"  (1.854 m), weight 80.7 kg, SpO2 100 %.Body mass index is 23.48 kg/m.  General Appearance: Casual  Eye Contact::  Good  Speech:  Clear and Coherent409  Volume:  Normal  Mood:  Euthymic  Affect:  Congruent  Thought Process:  Coherent  Orientation:  Full (Time, Place, and Person)  Thought Content:  Logical  Suicidal Thoughts:  No  Homicidal Thoughts:  No  Memory:  Immediate;   Fair Recent;   Fair Remote;   Fair  Judgement:  Fair  Insight:  Fair  Psychomotor Activity:  Normal  Concentration:  Fair  Recall:  [phone] of Knowledge:Fair  Language: Fair  Akathisia:  No  Handed:  Right  AIMS (if indicated):     Assets:  Desire for Improvement Physical Health Resilience  Sleep:  Number of Hours: 6.75  Cognition: WNL  ADL's:  Intact   Mental Status Per Nursing Assessment::   On Admission:  NA  Demographic Factors:  Male, Caucasian and Unemployed  Loss Factors: Financial problems/change in socioeconomic status  Historical Factors: Impulsivity  Risk Reduction Factors:   Responsible for children under 56 years of age, Living  with another person, especially a relative and Positive social support  Continued Clinical Symptoms:  Depression:   Comorbid alcohol abuse/dependence Alcohol/Substance Abuse/Dependencies  Cognitive Features That Contribute To Risk:  None    Suicide Risk:  Minimal: No identifiable suicidal ideation.  Patients presenting with no risk factors but with morbid ruminations; may be classified as minimal risk based on the severity of the depressive symptoms  Follow-up Information    [facility] Follow up on 03/06/2020.   Why: You have a zoom meeting scheduled for 03/06/20 at 7AM with 03/08/20 for peer support services. Contact information: [address] [place] [phone]           Plan Of Care/Follow-up recommendations:  Activity:  Activity as tolerated Diet:  Regular diet Other:  Follow-up with outpatient treatment  [phone], MD 02/29/2020, 11:02 AM

## 2020-02-29 NOTE — [facility] Group Notes (Signed)
[facility] Group Notes:  (Nursing/MHT/Case Management/Adjunct)  Date:  02/29/2020  Time:  9:34 AM  Type of Therapy:  Community Meeting  Participation Level:  Did Not Attend   Tyeshia Cornforth Travis Vicci Reder 02/29/2020, 9:34 AM 
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
## 2020-02-29 NOTE — Progress Notes (Signed)
Patient denies SI/HI, denies A/V hallucinations. Patient verbalizes understanding of discharge instructions, follow up care and prescriptions.7 days medicines given to patient. Patient given all belongings from [facility] locker. Patient escorted out by staff, transported by family. 

## 2020-02-29 NOTE — Progress Notes (Signed)
  [facility] Adult Case Management Discharge Plan :  Will you be returning to the same living situation after discharge:  Yes,  home At discharge, do you have transportation home?: Yes,  pts wife will pick her up Do you have the ability to pay for your medications: Yes,  mental health  Release of information consent forms completed and in the chart;   Patient to Follow up at: Follow-up Information    [facility] Follow up on 03/06/2020.   Why: You have a zoom meeting scheduled for 03/06/20 at 7AM with Lorella Nimrod for peer support services. Contact information: [address] [place] [address] [phone]           Next level of care provider has access to [facility] Link:no  Safety Planning and Suicide Prevention discussed: Yes,  SPE completed with pt as pt declined collateral contact  Have you used any form of tobacco in the last 30 days? (Cigarettes, Smokeless Tobacco, Cigars, and/or Pipes): Yes  Has patient been referred to the Quitline?: Patient refused referral  Patient has been referred for addiction treatment: Pt. refused referral  Mechele Dawley, LCSW 02/29/2020, 9:08 AM

## 2021-03-30 ENCOUNTER — Emergency Department
Admission: EM | Admit: 2021-03-30 | Discharge: 2021-03-30 | Disposition: A | Discharge status: Home / Self Care | Payer: Medicaid Other | Attending: Emergency Medicine | Admitting: Emergency Medicine

## 2021-03-30 ENCOUNTER — Other Ambulatory Visit: Payer: Self-pay

## 2021-03-30 DIAGNOSIS — F12188 Cannabis abuse with other cannabis-induced disorder: Secondary | ICD-10-CM | POA: Insufficient documentation

## 2021-03-30 DIAGNOSIS — R112 Nausea with vomiting, unspecified: Secondary | ICD-10-CM

## 2021-03-30 DIAGNOSIS — F17210 Nicotine dependence, cigarettes, uncomplicated: Secondary | ICD-10-CM | POA: Insufficient documentation

## 2021-03-30 LAB — LIPASE, BLOOD: Lipase: 29 U/L (ref 11–51)

## 2021-03-30 LAB — COMPREHENSIVE METABOLIC PANEL
ALT: 41 U/L (ref 0–44)
AST: 40 U/L (ref 15–41)
Albumin: 4.8 g/dL (ref 3.5–5.0)
Alkaline Phosphatase: 81 U/L (ref 38–126)
Anion gap: 8 (ref 5–15)
BUN: 15 mg/dL (ref 6–20)
CO2: 23 mmol/L (ref 22–32)
Calcium: 9.2 mg/dL (ref 8.9–10.3)
Chloride: 109 mmol/L (ref 98–111)
Creatinine, Ser: 0.88 mg/dL (ref 0.61–1.24)
GFR, Estimated: >60 mL/min (ref >60)
Glucose, Bld: 151 mg/dL — ABNORMAL HIGH (ref 70–99)
Potassium: 3.9 mmol/L (ref 3.5–5.1)
Sodium: 140 mmol/L (ref 135–145)
Total Bilirubin: 0.8 mg/dL (ref 0.3–1.2)
Total Protein: 7.1 g/dL (ref 6.5–8.1)

## 2021-03-30 LAB — CBC
HCT: 43.1 % (ref 39.0–52.0)
Hemoglobin: 14.9 g/dL (ref 13.0–17.0)
MCH: 29.1 pg (ref 26.0–34.0)
MCHC: 34.6 g/dL (ref 30.0–36.0)
MCV: 84.2 fL (ref 80.0–100.0)
Platelets: 288 10*3/uL (ref 150–400)
RBC: 5.12 MIL/uL (ref 4.22–5.81)
RDW: 13.2 % (ref 11.5–15.5)
WBC: 11.4 10*3/uL — ABNORMAL HIGH (ref 4.0–10.5)
nRBC: 0 % (ref 0.0–0.2)

## 2021-03-30 MED ORDER — DROPERIDOL 2.5 MG/ML IJ SOLN
2.5000 mg | Freq: Once | INTRAMUSCULAR | Status: AC
  Administered 2021-03-30: 2.5 mg via INTRAVENOUS
  Filled 2021-03-30: qty 2

## 2021-03-30 MED ORDER — ONDANSETRON 4 MG PO TBDP: 4.0000 mg | ORAL_TABLET | Freq: Three times a day (TID) | ORAL | 0 refills | Status: AC | PRN

## 2021-03-30 MED ORDER — ONDANSETRON 4 MG PO TBDP
4.0000 mg | ORAL_TABLET | Freq: Three times a day (TID) | ORAL | 0 refills | Status: DC | PRN
  Filled 2021-03-30: qty 20, 7d supply, fill #0

## 2021-03-30 MED ORDER — LACTATED RINGERS IV BOLUS
1000.0000 mL | Freq: Once | INTRAVENOUS | Status: AC
  Administered 2021-03-30: 1000 mL via INTRAVENOUS

## 2021-03-30 NOTE — ED Triage Notes (Signed)
Pt c/o lower abd pain with N/V/D since this morning.. 

## 2021-03-30 NOTE — ED Notes (Signed)
ED Provider at bedside. 

## 2021-03-30 NOTE — Discharge Instructions (Addendum)
As we discussed, start with hot showers in the future if this happens again.  This may be related to your cannabis smoking.  The best way to prevent further episodes is to stop smoking.  I also sent a prescription for Zofran nausea medicine to use as needed at home.  Return to the ED with any further worsening symptoms despite these measures.

## 2021-03-30 NOTE — ED Notes (Signed)
Pt calm , collective , denied pain or sob  

## 2021-03-30 NOTE — ED Provider Notes (Signed)
[facility] Emergency Department Provider Note ____________________________________________   Event Date/Time   First MD Initiated Contact with Patient 03/30/21 1146     (approximate)  I have reviewed the triage vital signs and the nursing notes.  HISTORY  Chief Complaint Abdominal Pain   HPI Jeremy Becker is a 35 y.o. malewho presents to the ED for evaluation of abdominal pain.  Chart review indicates history of depression and previous alcohol abuse.  Patient presents to the ED due to recurrent nausea, vomiting and diarrhea since he woke up this morning.  Reports feeling normal yesterday and his symptoms started when he awakened this morning.  Reports a handful of episodes of nonbloody nonbilious emesis .  Further reports 1-2 episodes of loose stool this morning without melena or hematochezia.  Reports primarily "I just cannot keep anything down."  He reports a discomfort throughout his abdomen without focal features but minimizes this and indicates primarily nausea.  He does report smoking cannabis regularly, last smoking last night.  Denies additional recreational drug use.   History reviewed. No pertinent past medical history.  Patient Active Problem List   Diagnosis Date Noted  . MDD (major depressive disorder), recurrent episode, severe ([facility]) 02/28/2020  . Alcohol abuse 02/28/2020    History reviewed. No pertinent surgical history.  Prior to Admission medications   Medication Sig Start Date End Date Taking? Authorizing Provider  ondansetron (ZOFRAN ODT) 4 MG disintegrating tablet Take 1 tablet (4 mg total) by mouth every 8 (eight) hours as needed for nausea or vomiting. 03/30/21  Yes Delton Prairie, MD  FLUoxetine (PROZAC) 20 MG capsule Take 1 capsule (20 mg total) by mouth daily. 02/29/20   Money, Gerlene Burdock, FNP  traZODone (DESYREL) 100 MG tablet Take 1 tablet (100 mg total) by mouth at bedtime as needed for sleep. 02/29/20   Money, Gerlene Burdock, FNP     Allergies Patient has no known allergies.  No family history on file.  Social History Social History   Tobacco Use  . Smoking status: Current Every Day Smoker    Packs/day: 1.00    Types: Cigarettes  . Smokeless tobacco: Never Used  Substance Use Topics  . Alcohol use: Yes    Alcohol/week: 2.0 standard drinks    Types: 2 Cans of beer per week    Comment: "two 22 oz can q day"  . Drug use: Yes    Types: Marijuana    Review of Systems  Constitutional: No fever/chills Eyes: No visual changes. ENT: No sore throat. Cardiovascular: Denies chest pain. Respiratory: Denies shortness of breath. Gastrointestinal: Positive for abdominal pain, nausea, vomiting and diarrhea.   No constipation. Genitourinary: Negative for dysuria. Musculoskeletal: Negative for back pain. Skin: Negative for rash. Neurological: Negative for headaches, focal weakness or numbness.  ____________________________________________   PHYSICAL EXAM:  VITAL SIGNS: Vitals:   03/30/21 1229 03/30/21 1348  BP: 120/69 102/80  Pulse: (!) 48 60  Resp: 18 18  Temp: 97.9 F (36.6 C) 98 F (36.7 C)  SpO2: 98% 100%     Constitutional: Alert and oriented.  Appears uncomfortable, but no acute distress. Eyes: Conjunctivae are normal. PERRL. EOMI. Head: Atraumatic. Nose: No congestion/rhinnorhea. Mouth/Throat: Mucous membranes are dry.  Oropharynx non-erythematous. Neck: No stridor. No cervical spine tenderness to palpation. Cardiovascular: Normal rate, regular rhythm. Grossly normal heart sounds.  Good peripheral circulation. Respiratory: Normal respiratory effort.  No retractions. Lungs CTAB. Gastrointestinal: Soft , nondistended. No CVA tenderness. Soft throughout with mild tenderness without focal features  or peritoneal features. Musculoskeletal: No lower extremity tenderness nor edema.  No joint effusions. No signs of acute trauma. Neurologic:  Normal speech and language. No gross focal neurologic  deficits are appreciated. No gait instability noted. Skin:  Skin is warm, dry and intact. No rash noted. Psychiatric: Mood and affect are normal. Speech and behavior are normal.  ____________________________________________   LABS (all labs ordered are listed, but only abnormal results are displayed)  Labs Reviewed  COMPREHENSIVE METABOLIC PANEL - Abnormal; Notable for the following components:      Result Value   Glucose, Bld 151 (*)    All other components within normal limits  CBC - Abnormal; Notable for the following components:   WBC 11.4 (*)    All other components within normal limits  LIPASE, BLOOD  URINALYSIS, COMPLETE (UACMP) WITH MICROSCOPIC     ____________________________________________  RADIOLOGY  ED MD interpretation:    Official radiology report(s): No results found.  ____________________________________________   PROCEDURES and INTERVENTIONS  Procedure(s) performed (including Critical Care):  Procedures  Medications  lactated ringers bolus 1,000 mL (0 mLs Intravenous Stopped 03/30/21 1329)  droperidol (INAPSINE) 2.5 MG/ML injection 2.5 mg (2.5 mg Intravenous Given 03/30/21 1215)    ____________________________________________   MDM / ED COURSE   35 year old male presents to the ED with recurrent emesis and abdominal discomfort, likely due to cannabis hyperemesis syndrome, and amenable to outpatient management.  Normal vitals.  Exam with soft and benign abdomen, but with dry mucous membranes suggestive of dehydration.  Provided empiric droperidol with resolution of his symptoms, and be hydrated with a liter of LR.  Subsequently tolerating p.o. intake and having resolution of symptoms.  I suspect viral syndrome or cannabis hyperemesis, less likely SBO, diverticulitis or appendicitis considering his benign exam.  We discussed cannabis cessation and return precautions for the ED prior to discharge.  Clinical Course as of 03/30/21 1355  Mon Mar 30, 2021   1224 Discussed the patient the possibility of viral syndrome or cannabis hyperemesis syndrome.  Discussed empiric treatment for this and possibility of CT imaging if his symptoms are unrelenting.  He is agreeable. [DS]  1307 Reassessed.  Patient reports feeling much better with resolution of symptoms.  Reexamination of his abdomen reveals benign exam.  We discussed no need for CT imaging.  He is agreeable.  We discussed p.o. challenge and likely discharge. [DS]    Clinical Course User Index [DS] Delton Prairie, MD    ____________________________________________   FINAL CLINICAL IMPRESSION(S) / ED DIAGNOSES  Final diagnoses:  Cannabis hyperemesis syndrome concurrent with and due to cannabis abuse (HCC)  Nausea vomiting and diarrhea     ED Discharge Orders         Ordered    ondansetron (ZOFRAN ODT) 4 MG disintegrating tablet  Every 8 hours PRN        03/30/21 1338           Taijah Macrae   Note:  This document was prepared using Conservation officer, historic buildings and may include unintentional dictation errors.   Delton Prairie, MD 03/30/21 1356

## 2021-10-30 IMAGING — CR DG WRIST COMPLETE 3+V*L*
1 series · 4 of 4 positions shown · non-contrast
Comparison: None.

CLINICAL DATA: Recent fall with wrist pain, initial encounter

EXAM:
LEFT WRIST - COMPLETE 3+ VIEW

[Series 1: dg wrist complete left · 0.14mm/px · 4 of 4 slices shown]
[im 1/4]
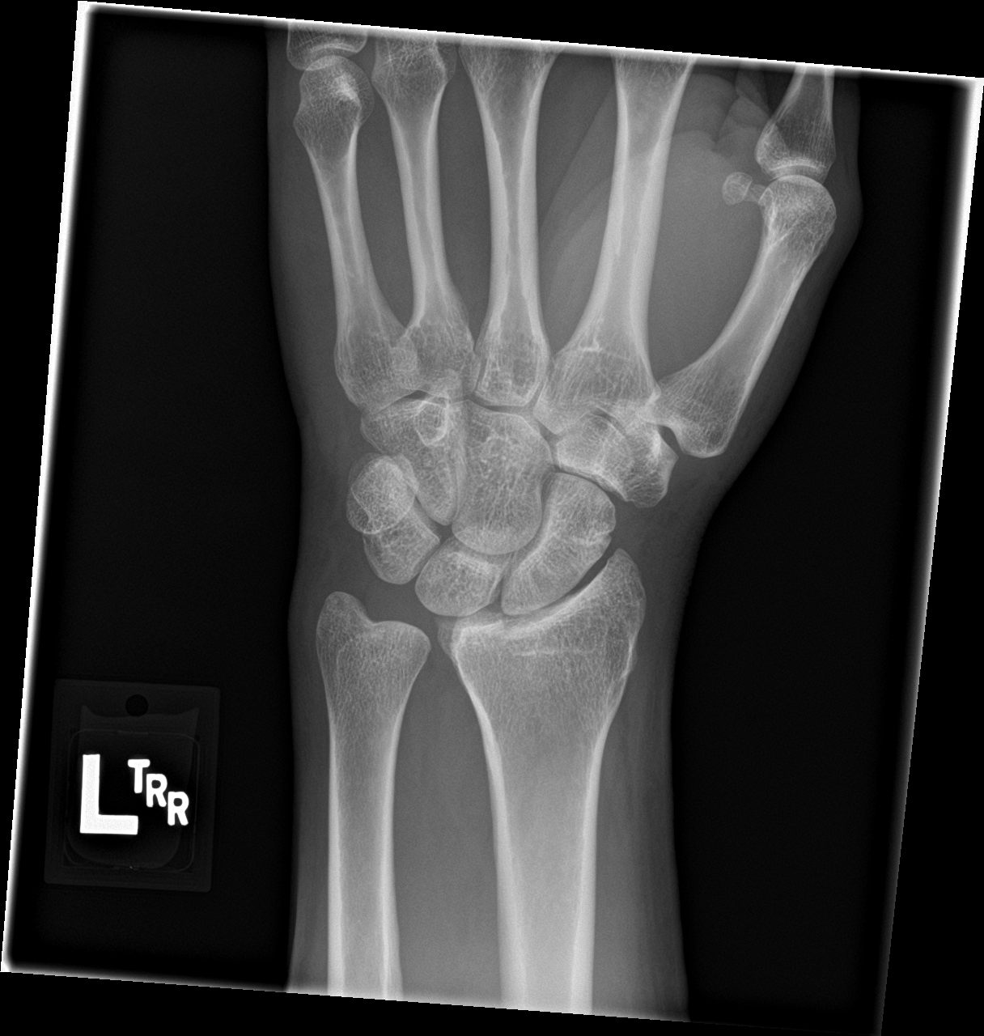
[im 2/4]
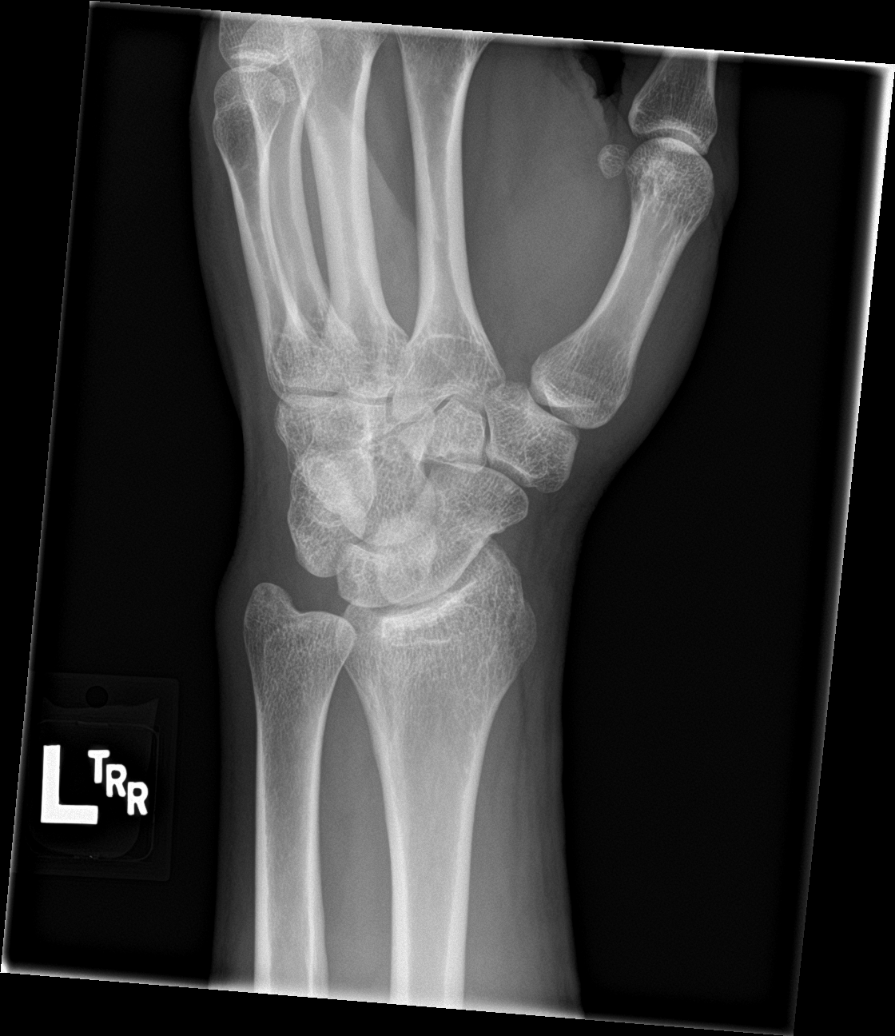
[im 3/4]
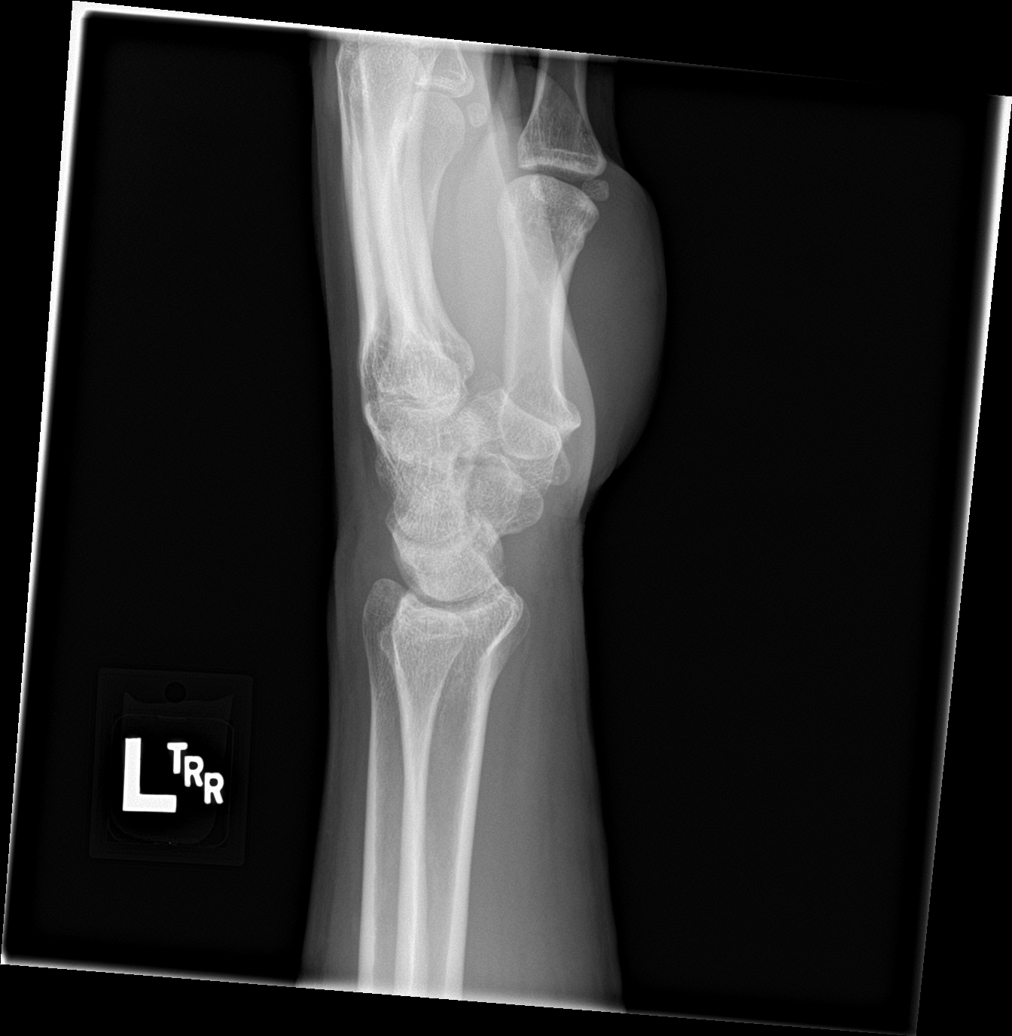
[im 4/4]
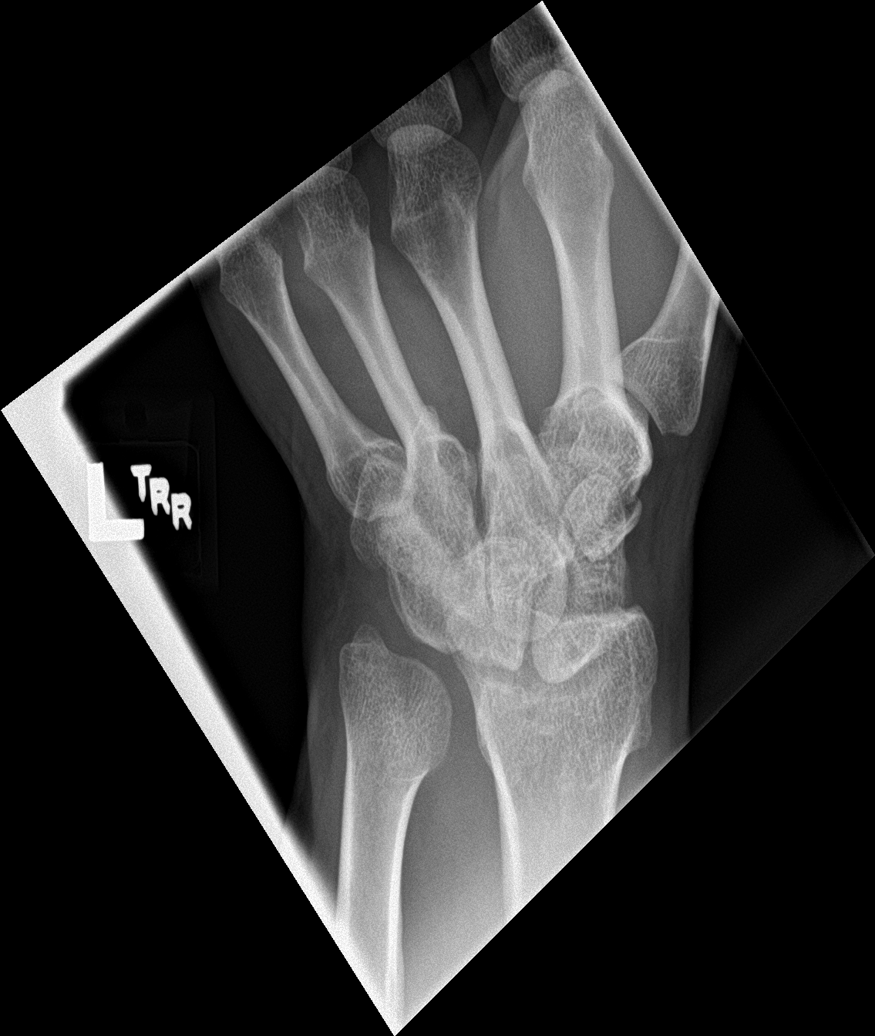

[4 of 4 positions shown; findings below may reference images not displayed]

FINDINGS: There is no evidence of fracture or dislocation. There is no
evidence of arthropathy or other focal bone abnormality. Soft
tissues are unremarkable.
IMPRESSION: No acute abnormality noted.
